# Patient Record
Sex: Female | Born: 2000 | Race: White | Hispanic: No | Marital: Single | Smoking: Never smoker
Health system: Southern US, Community
[De-identification: ages and names within clinical notes are randomized; demographics above are authoritative.]

## PROBLEM LIST (undated history)

## (undated) DIAGNOSIS — G90519 Complex regional pain syndrome I of unspecified upper limb: Secondary | ICD-10-CM

## (undated) DIAGNOSIS — S4990XA Unspecified injury of shoulder and upper arm, unspecified arm, initial encounter: Secondary | ICD-10-CM

## (undated) DIAGNOSIS — G9050 Complex regional pain syndrome I, unspecified: Secondary | ICD-10-CM

---
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
## 2018-07-19 IMAGING — DX DG LUMBAR SPINE COMPLETE 4+V
5 series · 5 of 5 positions shown · non-contrast
Comparison: None in PACs

CLINICAL DATA: Status post fall 2 weeks ago on a wet floor work
with persistent right-sided low back pain and right hip pain.
History of chronic right SI joint discomfort.

EXAM:
LUMBAR SPINE - COMPLETE 4+ VIEW

[l-spine ap]
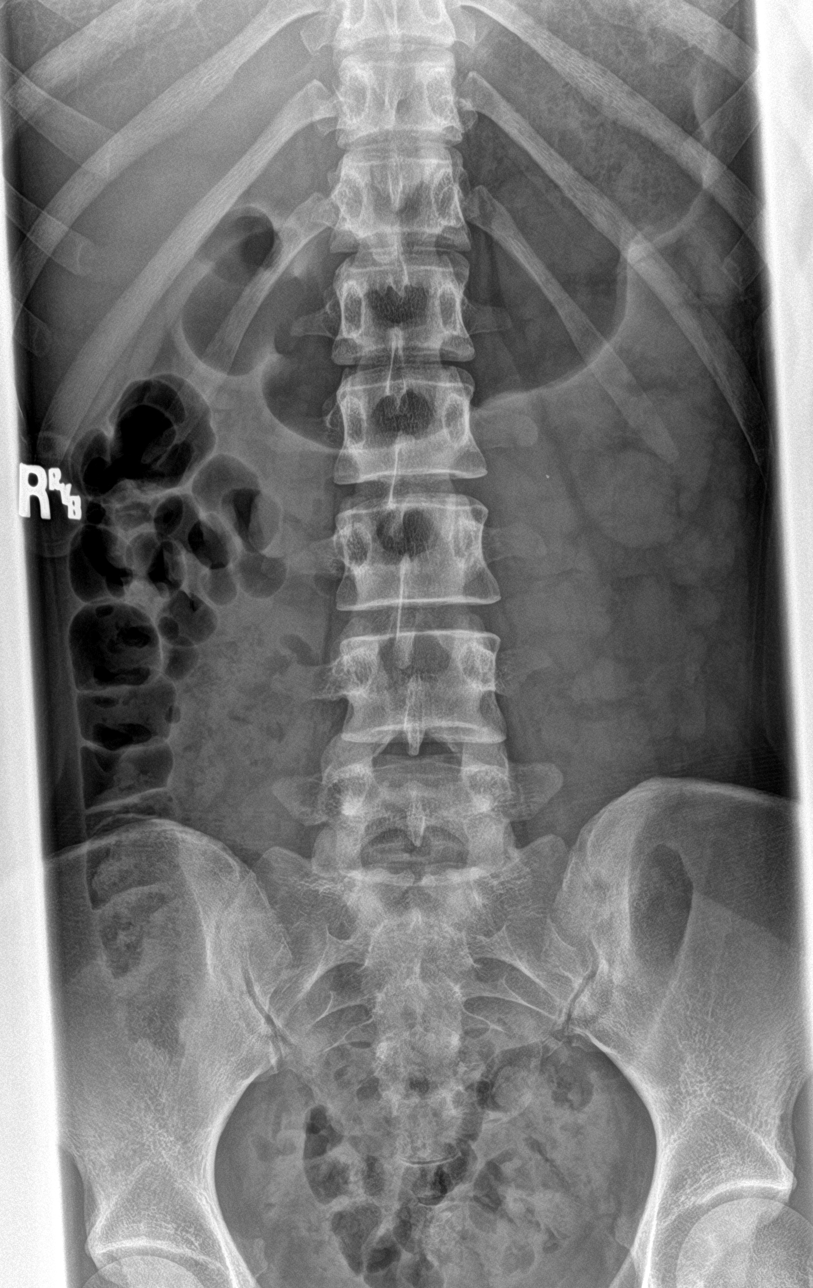

[l-spine obl (1 of 2)]
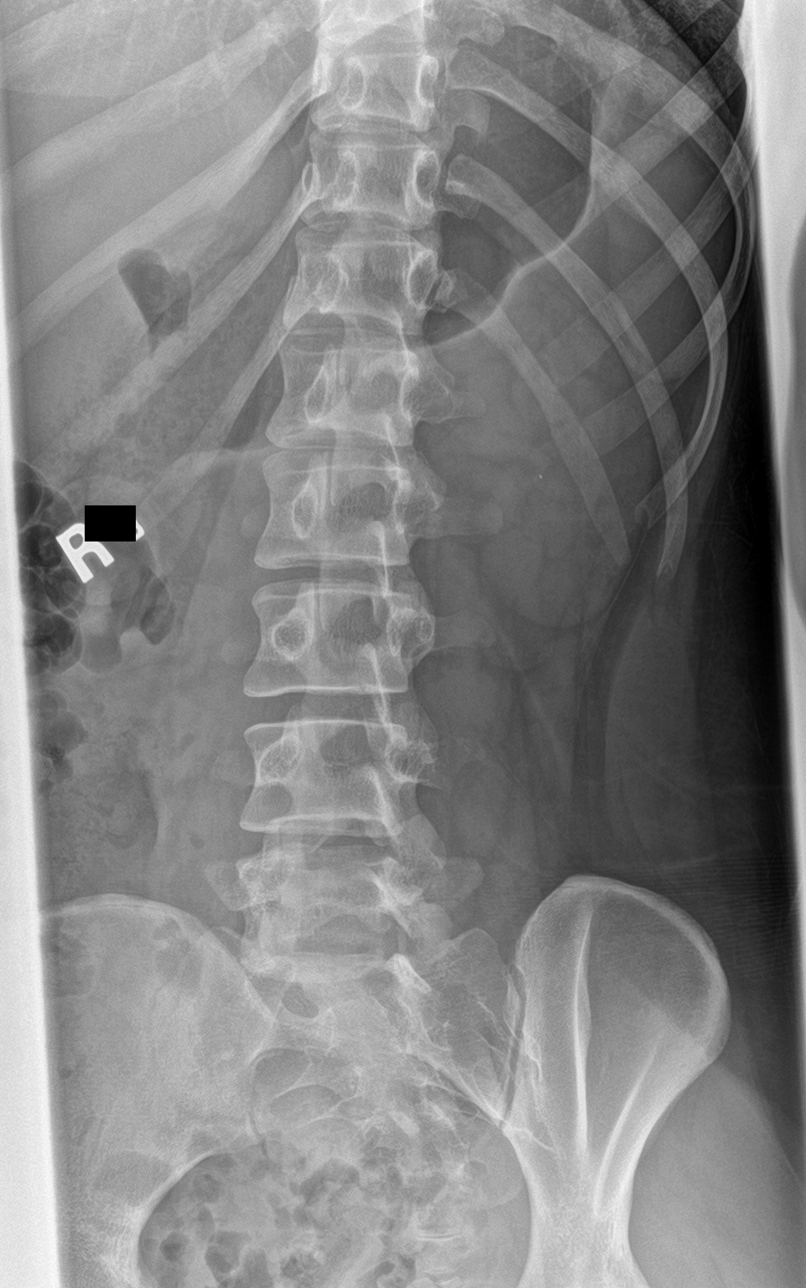

[l-spine obl (2 of 2)]
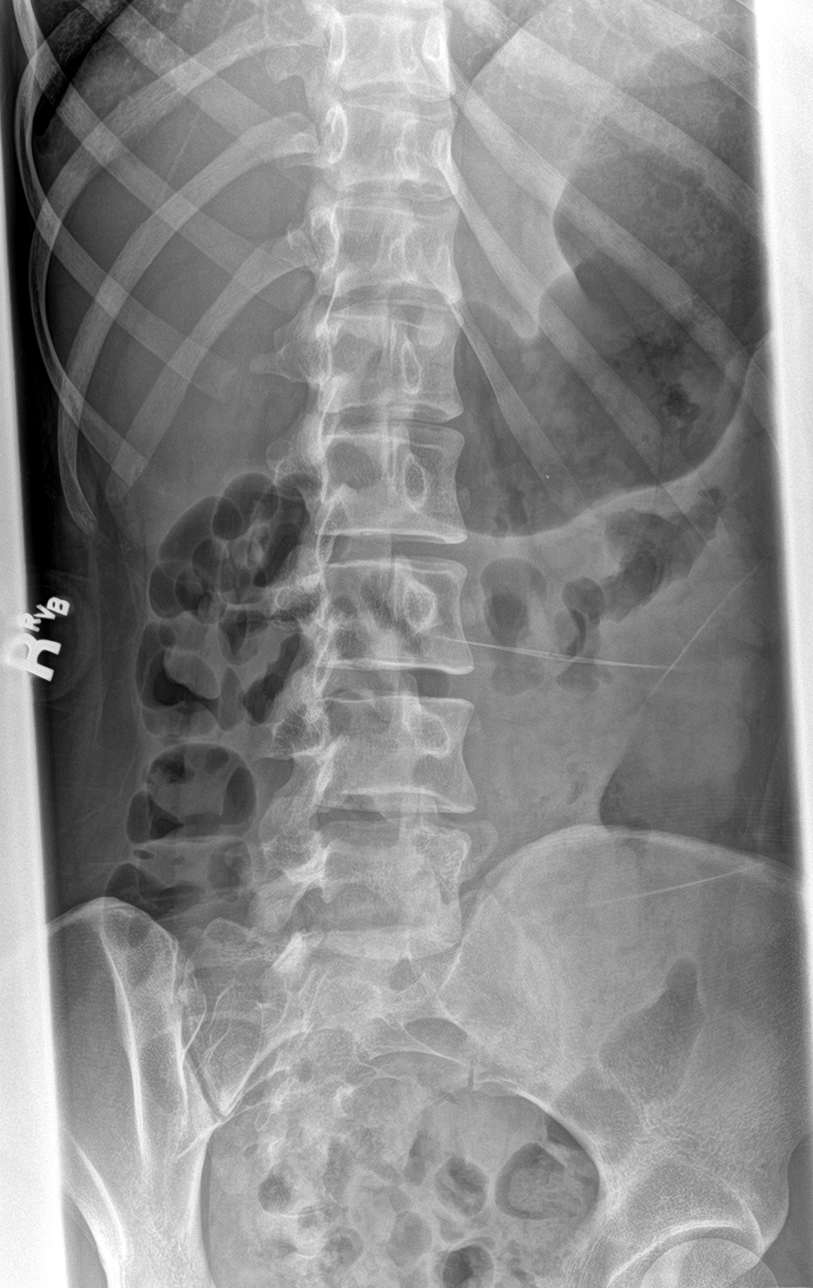

[l-spine lat]
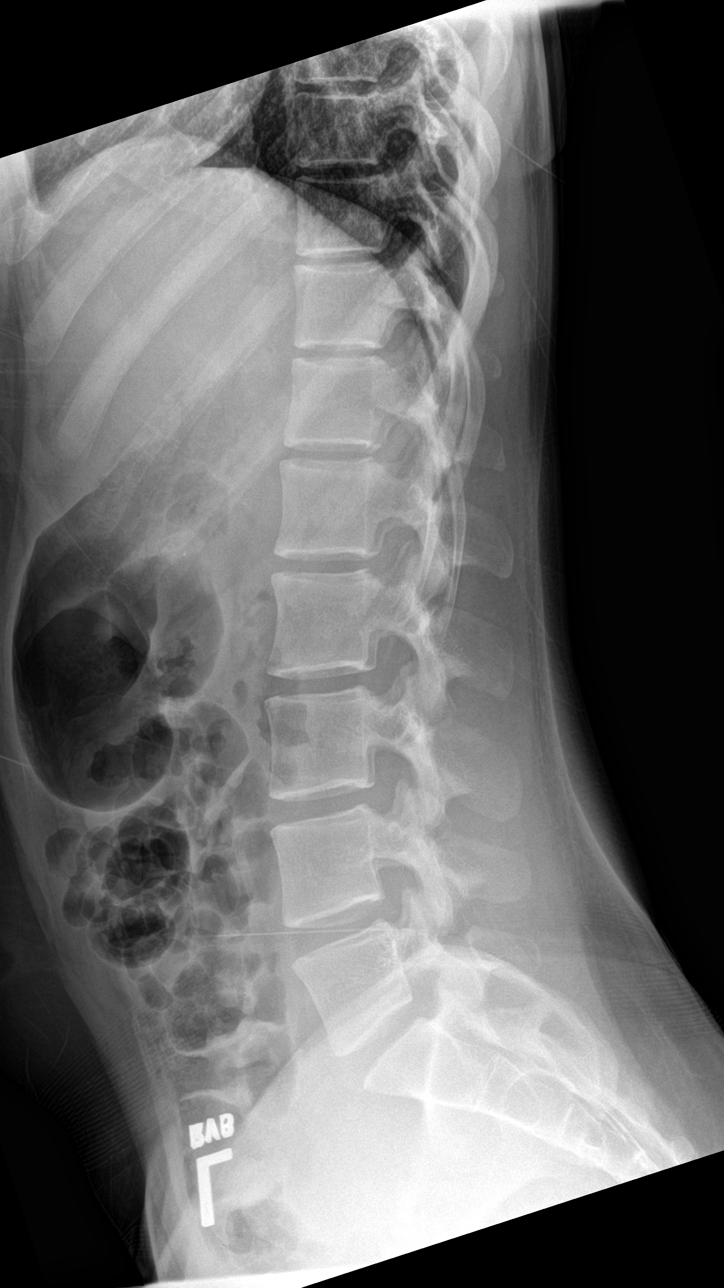

[l-spine spot]
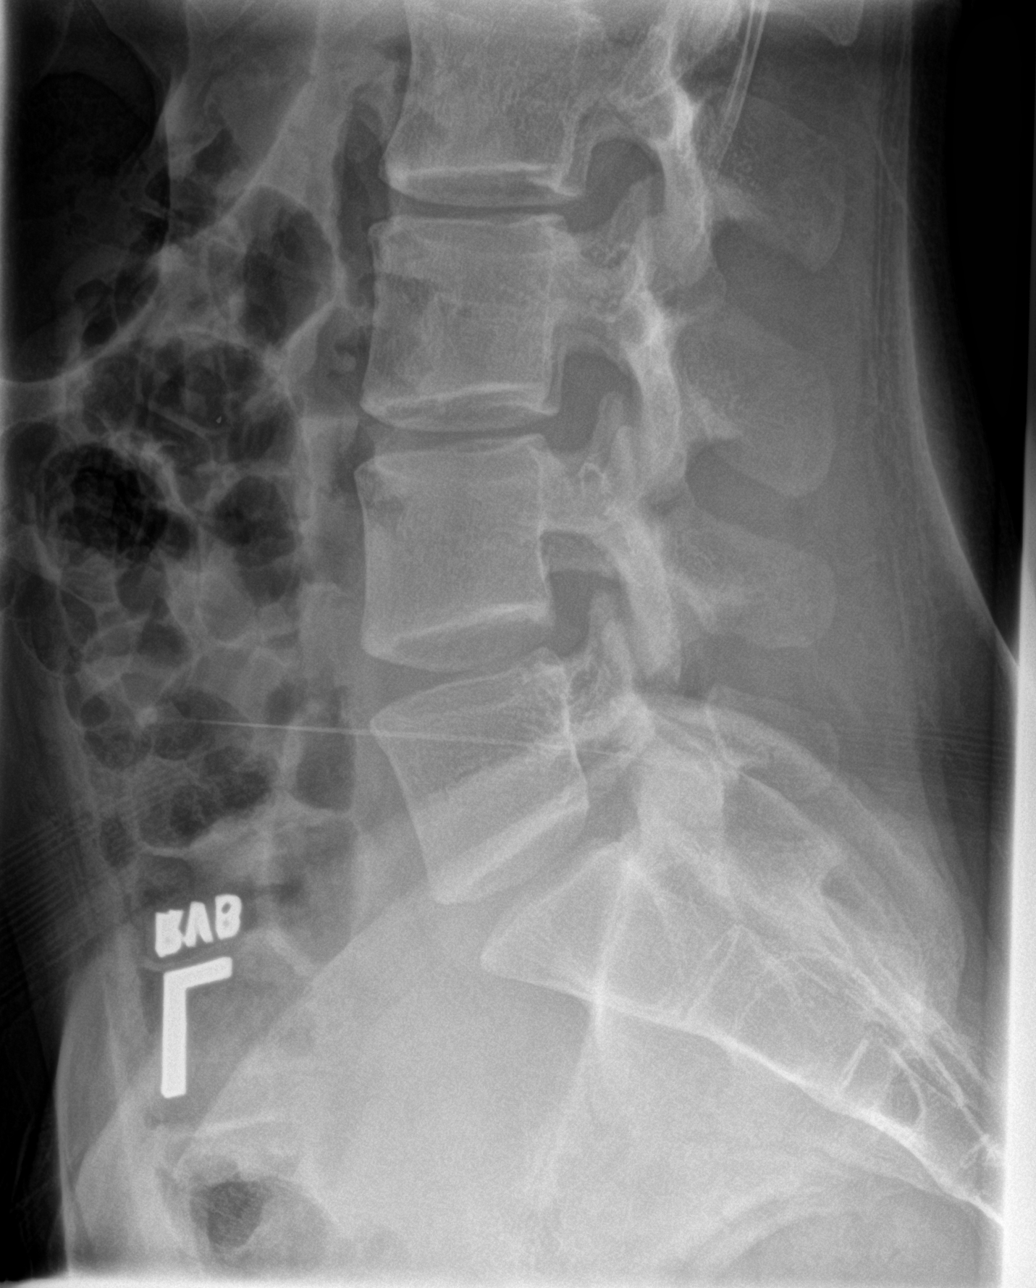

[5 of 5 positions shown; findings below may reference images not displayed]

FINDINGS: The lumbar vertebral bodies are preserved in height. The disc space
heights are well maintained. The pedicles and transverse processes
are intact. There is no spondylolisthesis. There is no significant
facet joint hypertrophy. There is spina bifida occulta at S1 which
is a normal variant. The observed portions of the SI joints are
normal.
IMPRESSION: There is no acute bony abnormality of the lumbar spine.

## 2019-04-28 ENCOUNTER — Other Ambulatory Visit: Payer: Self-pay

## 2019-04-28 DIAGNOSIS — Z20822 Contact with and (suspected) exposure to covid-19: Secondary | ICD-10-CM

## 2019-04-30 LAB — NOVEL CORONAVIRUS, NAA: SARS-CoV-2, NAA: NOT DETECTED
